# Patient Record
Sex: Male | Born: 1980 | Hispanic: Yes | Marital: Single | Smoking: Never smoker
Health system: Southern US, Community
[De-identification: ages and names within clinical notes are randomized; demographics above are authoritative.]

---

## 2018-02-22 ENCOUNTER — Emergency Department (HOSPITAL_COMMUNITY)
Admission: EM | Admit: 2018-02-22 | Discharge: 2018-02-23 | Disposition: A | Discharge status: Home / Self Care | Payer: Self-pay | Attending: Emergency Medicine | Admitting: Emergency Medicine

## 2018-02-22 ENCOUNTER — Other Ambulatory Visit: Payer: Self-pay

## 2018-02-22 ENCOUNTER — Emergency Department (HOSPITAL_COMMUNITY): Payer: Self-pay

## 2018-02-22 ENCOUNTER — Encounter (HOSPITAL_COMMUNITY): Payer: Self-pay | Admitting: Emergency Medicine

## 2018-02-22 DIAGNOSIS — R1013 Epigastric pain: Secondary | ICD-10-CM | POA: Insufficient documentation

## 2018-02-22 DIAGNOSIS — R112 Nausea with vomiting, unspecified: Secondary | ICD-10-CM | POA: Insufficient documentation

## 2018-02-22 DIAGNOSIS — R197 Diarrhea, unspecified: Secondary | ICD-10-CM | POA: Insufficient documentation

## 2018-02-22 LAB — COMPREHENSIVE METABOLIC PANEL
ALK PHOS: 97 U/L (ref 38–126)
ALT: 35 U/L (ref 17–63)
AST: 22 U/L (ref 15–41)
Albumin: 3.9 g/dL (ref 3.5–5.0)
Anion gap: 8 (ref 5–15)
BUN: 11 mg/dL (ref 6–20)
CALCIUM: 9.1 mg/dL (ref 8.9–10.3)
CO2: 25 mmol/L (ref 22–32)
CREATININE: 0.87 mg/dL (ref 0.61–1.24)
Chloride: 103 mmol/L (ref 101–111)
Glucose, Bld: 110 mg/dL — ABNORMAL HIGH (ref 65–99)
Potassium: 3.4 mmol/L — ABNORMAL LOW (ref 3.5–5.1)
Sodium: 136 mmol/L (ref 135–145)
Total Bilirubin: 0.7 mg/dL (ref 0.3–1.2)
Total Protein: 7.3 g/dL (ref 6.5–8.1)

## 2018-02-22 LAB — URINALYSIS, ROUTINE W REFLEX MICROSCOPIC
Bilirubin Urine: NEGATIVE
Glucose, UA: NEGATIVE mg/dL
KETONES UR: NEGATIVE mg/dL
Nitrite: NEGATIVE
PROTEIN: NEGATIVE mg/dL
Specific Gravity, Urine: 1.02 (ref 1.005–1.030)
pH: 6 (ref 5.0–8.0)

## 2018-02-22 LAB — CBC
HCT: 45.3 % (ref 39.0–52.0)
Hemoglobin: 15.5 g/dL (ref 13.0–17.0)
MCH: 30.1 pg (ref 26.0–34.0)
MCHC: 34.2 g/dL (ref 30.0–36.0)
MCV: 88 fL (ref 78.0–100.0)
PLATELETS: 157 10*3/uL (ref 150–400)
RBC: 5.15 MIL/uL (ref 4.22–5.81)
RDW: 12.4 % (ref 11.5–15.5)
WBC: 9.2 10*3/uL (ref 4.0–10.5)

## 2018-02-22 LAB — URINALYSIS, MICROSCOPIC (REFLEX)

## 2018-02-22 LAB — LIPASE, BLOOD: Lipase: 26 U/L (ref 11–51)

## 2018-02-22 NOTE — ED Provider Notes (Signed)
Patient placed in Quick Look pathway, seen and evaluated   Chief Complaint: abdominal pain  HPI:   Kenneth Price is a 37 y.o. male who presents to the ED with abdominal pain. The pain started yesterday and has gotten worse. The pain started all over with a feeling fullness and then went to the RUQ. Patient reports nausea and diarrhea.patient reports he has had similar pain in the past in the right side but it usually goes away but today it did not.    ROS: GI: abdominal pain nausea, diarrhea    Physical Exam:  BP 127/78 (BP Location: Right Arm)   Pulse (!) 108   Temp 99.3 F (37.4 C) (Oral)   Resp 14   SpO2 98%    Gen: No distress  Neuro: Awake and Alert  Skin: Warm  Abdomen: soft, tender with palpation to RUQ and epigastric area, no guarding or rebound   Initiation of care has begun. The patient has been counseled on the process, plan, and necessity for staying for the completion/evaluation, and the remainder of the medical screening examination    Janne Napoleoneese, Hope M, NP 02/22/18 2032    Melene PlanFloyd, Dan, DO 02/22/18 2324

## 2018-02-22 NOTE — ED Triage Notes (Signed)
Pt states RUQ abdominal pain since yesterday with nausea and diarrhea and headaches. Pt also states his feet hurt with generalized body aches. Pt took tylenol at 6pm. Pt drinks alcohol only on the weekends only.

## 2018-02-23 ENCOUNTER — Emergency Department (HOSPITAL_COMMUNITY): Payer: Self-pay

## 2018-02-23 MED ORDER — GI COCKTAIL ~~LOC~~
30.0000 mL | Freq: Once | ORAL | Status: AC
  Administered 2018-02-23: 30 mL via ORAL
  Filled 2018-02-23: qty 30

## 2018-02-23 MED ORDER — ONDANSETRON HCL 4 MG/2ML IJ SOLN
4.0000 mg | Freq: Once | INTRAMUSCULAR | Status: AC
  Administered 2018-02-23: 4 mg via INTRAVENOUS
  Filled 2018-02-23: qty 2

## 2018-02-23 MED ORDER — MORPHINE SULFATE (PF) 4 MG/ML IV SOLN
4.0000 mg | Freq: Once | INTRAVENOUS | Status: AC
  Administered 2018-02-23: 4 mg via INTRAVENOUS
  Filled 2018-02-23: qty 1

## 2018-02-23 MED ORDER — ONDANSETRON 4 MG PO TBDP: 4.0000 mg | ORAL_TABLET | Freq: Once | ORAL | Status: DC

## 2018-02-23 MED ORDER — ONDANSETRON 4 MG PO TBDP: 4.0000 mg | ORAL_TABLET | Freq: Three times a day (TID) | ORAL | 0 refills | Status: AC | PRN

## 2018-02-23 NOTE — ED Provider Notes (Signed)
[facility] EMERGENCY DEPARTMENT Provider Note   CSN: [id number] Arrival date & time: 02/22/18  1933     History   Chief Complaint Chief Complaint  Patient presents with  . Abdominal Pain    HPI Kenneth Price is a 37 y.o. male.  Patient presents to the emergency department with a chief complaint of abdominal pain.  He reports having the symptoms since Sunday night.  He reports that he has had some associated nausea and diarrhea.  He denies any vomiting.  He reports subjective fevers and chills.  He states that he does have some flank pain on the right side as well.  He denies any known sick contacts.  He has not taken anything for symptoms.  The history is provided by the patient and the spouse. A language interpreter was used.    History reviewed. No pertinent past medical history.  There are no active problems to display for this patient.     Home Medications    Prior to Admission medications   Not on File    Family History No family history on file.  Social History Social History   Tobacco Use  . Smoking status: Not on file  Substance Use Topics  . Alcohol use: Not on file  . Drug use: Not on file     Allergies   Patient has no known allergies.   Review of Systems Review of Systems  All other systems reviewed and are negative.    Physical Exam Updated Vital Signs BP 128/77   Pulse (!) 109   Temp 99.3 F (37.4 C)   Resp 19   SpO2 99%   Physical Exam  Constitutional: He is oriented to person, place, and time. He appears well-developed and well-nourished.  HENT:  Head: Normocephalic and atraumatic.  Eyes: Pupils are equal, round, and reactive to light. Conjunctivae and EOM are normal. Right eye exhibits no discharge. Left eye exhibits no discharge. No scleral icterus.  Neck: Normal range of motion. Neck supple. No JVD present.  Cardiovascular: Normal rate, regular rhythm and normal heart sounds. Exam reveals no  gallop and no friction rub.  No murmur heard. Pulmonary/Chest: Effort normal and breath sounds normal. No respiratory distress. He has no wheezes. He has no rales. He exhibits no tenderness.  Abdominal: Soft. He exhibits no distension and no mass. There is no tenderness. There is no rebound and no guarding.  Musculoskeletal: Normal range of motion. He exhibits no edema or tenderness.  Neurological: He is alert and oriented to person, place, and time.  Skin: Skin is warm and dry.  Psychiatric: He has a normal mood and affect. His behavior is normal. Judgment and thought content normal.  Nursing note and vitals reviewed.    ED Treatments / Results  Labs (all labs ordered are listed, but only abnormal results are displayed) Labs Reviewed  COMPREHENSIVE METABOLIC PANEL - Abnormal; Notable for the following components:      Result Value   Potassium 3.4 (*)    Glucose, Bld 110 (*)    All other components within normal limits  URINALYSIS, ROUTINE W REFLEX MICROSCOPIC - Abnormal; Notable for the following components:   Hgb urine dipstick SMALL (*)    Leukocytes, UA TRACE (*)    All other components within normal limits  URINALYSIS, MICROSCOPIC (REFLEX) - Abnormal; Notable for the following components:   Bacteria, UA RARE (*)    All other components within normal limits  LIPASE, BLOOD  CBC  EKG None  Radiology [place] Abdomen Limited  Result Date: 02/22/2018 CLINICAL DATA:  Right upper quadrant abdominal pain and nausea for 2 days. EXAM: ULTRASOUND ABDOMEN LIMITED RIGHT UPPER QUADRANT COMPARISON:  None. FINDINGS: Gallbladder: Contracted gallbladder, limiting evaluation. Patient is reportedly not NPO. No cholelithiasis demonstrated. No gallbladder wall thickening, pericholecystic fluid or sonographic Murphy's sign. Common bile duct: Diameter: 4 mm Liver: Liver parenchyma is diffusely markedly echogenic with posterior acoustic attenuation, compatible with diffuse hepatic steatosis. No  definite liver surface irregularity. No liver mass, noting significantly decreased sensitivity in the setting of marked steatosis. Portal vein is patent on color Doppler imaging with normal direction of blood flow towards the liver. IMPRESSION: 1. Contracted gallbladder, limiting evaluation. Patient is reportedly not NPO. No evidence of cholelithiasis or acute cholecystitis. 2. No biliary ductal dilatation. 3. Diffuse hepatic steatosis. Electronically Signed   By: Delbert PhenixJason A Poff M.D.   On: 02/22/2018 21:31    Procedures Procedures (including critical care time)  Medications Ordered in ED Medications  gi cocktail (Maalox,Lidocaine,Donnatal) (has no administration in time range)  ondansetron (ZOFRAN) injection 4 mg (has no administration in time range)  morphine 4 MG/ML injection 4 mg (has no administration in time range)     Initial Impression / Assessment and Plan / ED Course  I have reviewed the triage vital signs and the nursing notes.  Pertinent labs & imaging results that were available during my care of the patient were reviewed by me and considered in my medical decision making (see chart for details).     Patient with nausea and diarrhea.  He has not vomited.  He does have some right-sided abdominal pain.  Right upper quadrant ultrasound is negative, no evidence of cholecystitis or cholelithiasis.  He has no renal stones on CT.  Question colitis on CT.  This is consistent with the patient's symptoms.  Will give Zofran and recommend PCP follow-up.  Final Clinical Impressions(s) / ED Diagnoses   Final diagnoses:  Epigastric pain  Nausea vomiting and diarrhea    ED Discharge Orders    None       Roxy HorsemanBrowning, Jathen Sudano, PA-C 02/23/18 0300    Wilkie AyeHorton, Mayer Maskerourtney F, MD 02/23/18 [phone]

## 2021-05-11 ENCOUNTER — Encounter (HOSPITAL_COMMUNITY): Payer: Self-pay | Admitting: Emergency Medicine

## 2021-05-11 ENCOUNTER — Emergency Department (HOSPITAL_COMMUNITY)
Admission: EM | Admit: 2021-05-11 | Discharge: 2021-05-11 | Disposition: A | Discharge status: Home / Self Care | Payer: Self-pay | Attending: Emergency Medicine | Admitting: Emergency Medicine

## 2021-05-11 ENCOUNTER — Emergency Department (HOSPITAL_COMMUNITY): Payer: Self-pay

## 2021-05-11 ENCOUNTER — Other Ambulatory Visit: Payer: Self-pay

## 2021-05-11 DIAGNOSIS — R079 Chest pain, unspecified: Secondary | ICD-10-CM | POA: Insufficient documentation

## 2021-05-11 DIAGNOSIS — R202 Paresthesia of skin: Secondary | ICD-10-CM | POA: Insufficient documentation

## 2021-05-11 DIAGNOSIS — R519 Headache, unspecified: Secondary | ICD-10-CM | POA: Insufficient documentation

## 2021-05-11 DIAGNOSIS — R0602 Shortness of breath: Secondary | ICD-10-CM | POA: Insufficient documentation

## 2021-05-11 LAB — TROPONIN I (HIGH SENSITIVITY)
Troponin I (High Sensitivity): 3 ng/L (ref <18)
Troponin I (High Sensitivity): 4 ng/L (ref <18)

## 2021-05-11 LAB — BASIC METABOLIC PANEL
Anion gap: 8 (ref 5–15)
BUN: 13 mg/dL (ref 6–20)
CO2: 23 mmol/L (ref 22–32)
Calcium: 8.8 mg/dL — ABNORMAL LOW (ref 8.9–10.3)
Chloride: 105 mmol/L (ref 98–111)
Creatinine, Ser: 0.87 mg/dL (ref 0.61–1.24)
GFR, Estimated: >60 mL/min (ref >60)
Glucose, Bld: 99 mg/dL (ref 70–99)
Potassium: 4 mmol/L (ref 3.5–5.1)
Sodium: 136 mmol/L (ref 135–145)

## 2021-05-11 LAB — CBC
HCT: 45.9 % (ref 39.0–52.0)
Hemoglobin: 15.7 g/dL (ref 13.0–17.0)
MCH: 30.4 pg (ref 26.0–34.0)
MCHC: 34.2 g/dL (ref 30.0–36.0)
MCV: 89 fL (ref 80.0–100.0)
Platelets: 163 10*3/uL (ref 150–400)
RBC: 5.16 MIL/uL (ref 4.22–5.81)
RDW: 12.3 % (ref 11.5–15.5)
WBC: 8.2 10*3/uL (ref 4.0–10.5)
nRBC: 0 % (ref 0.0–0.2)

## 2021-05-11 MED ORDER — DIPHENHYDRAMINE HCL 50 MG/ML IJ SOLN
25.0000 mg | Freq: Once | INTRAMUSCULAR | Status: AC
  Administered 2021-05-11: 25 mg via INTRAVENOUS
  Filled 2021-05-11: qty 1

## 2021-05-11 MED ORDER — METOCLOPRAMIDE HCL 5 MG/ML IJ SOLN
10.0000 mg | Freq: Once | INTRAMUSCULAR | Status: AC
  Administered 2021-05-11: 10 mg via INTRAVENOUS
  Filled 2021-05-11: qty 2

## 2021-05-11 MED ORDER — LACTATED RINGERS IV BOLUS
1000.0000 mL | Freq: Once | INTRAVENOUS | Status: AC
Start: 2021-05-11 — End: 2021-05-11
  Administered 2021-05-11: 1000 mL via INTRAVENOUS

## 2021-05-11 MED ORDER — GADOBUTROL 1 MMOL/ML IV SOLN
10.0000 mL | Freq: Once | INTRAVENOUS | Status: AC | PRN
  Administered 2021-05-11: 10 mL via INTRAVENOUS

## 2021-05-11 MED ORDER — KETOROLAC TROMETHAMINE 15 MG/ML IJ SOLN
15.0000 mg | Freq: Once | INTRAMUSCULAR | Status: AC
  Administered 2021-05-11: 15 mg via INTRAVENOUS
  Filled 2021-05-11: qty 1

## 2021-05-11 NOTE — ED Triage Notes (Signed)
Pt reports intermittent chest pain x 1 month.  States he over exerted himself on Thursday and reports numbness to bilateral hands and pain across chest that is worse on the left side.  Denies sob, nausea, and vomiting.  Also reports headache.

## 2021-05-11 NOTE — ED Notes (Signed)
Pt transported to MRI 

## 2021-05-11 NOTE — ED Provider Notes (Signed)
With mild [facility] EMERGENCY DEPARTMENT Provider Note   CSN: [id number] Arrival date & time: 05/11/21  1304     History Chief Complaint  Patient presents with   Chest Pain    Kenneth Price is a 40 y.o. male.  HPI 40 year old male presents with complaints.  History is through the [place] interpreter.  His chief complaint seems to be chest pain and headache that both started 2 days ago while at work.  The chest is worse than the head.  Both feel like a pressure.  He has taken ibuprofen without relief.  Some shortness of breath.  He also endorses left arm and leg numbness/tingling for a month or so.  However it seems to be worse with the symptoms.  He transiently had some blurry vision yesterday while driving but that is gone.  History reviewed. No pertinent past medical history.  There are no problems to display for this patient.   History reviewed. No pertinent surgical history.     No family history on file.  Social History   Tobacco Use   Smoking status: Never   Smokeless tobacco: Never  Substance Use Topics   Alcohol use: Yes   Drug use: Not Currently    Home Medications Prior to Admission medications   Medication Sig Start Date End Date Taking? Authorizing Provider  ondansetron (ZOFRAN ODT) 4 MG disintegrating tablet Take 1 tablet (4 mg total) by mouth every 8 (eight) hours as needed for nausea or vomiting. 02/23/18   Roxy Horseman, PA-C    Allergies    Patient has no known allergies.  Review of Systems   Review of Systems  Constitutional:  Negative for fever.  Respiratory:  Positive for shortness of breath. Negative for cough.   Cardiovascular:  Positive for chest pain.  Neurological:  Positive for numbness and headaches. Negative for weakness.  All other systems reviewed and are negative.  Physical Exam Updated Vital Signs BP 107/80   Pulse (!) 54   Temp 97.9 F (36.6 C) (Oral)   Resp 14   Ht 5\' 6"  (1.676 m)   Wt  104.3 kg   SpO2 98%   BMI 37.12 kg/m   Physical Exam Vitals and nursing note reviewed.  Constitutional:      Appearance: He is well-developed.  HENT:     Head: Normocephalic and atraumatic.     Right Ear: External ear normal.     Left Ear: External ear normal.     Nose: Nose normal.  Eyes:     General:        Right eye: No discharge.        Left eye: No discharge.  Cardiovascular:     Rate and Rhythm: Normal rate and regular rhythm.     Heart sounds: Normal heart sounds.  Pulmonary:     Effort: Pulmonary effort is normal.     Breath sounds: Normal breath sounds.  Abdominal:     Palpations: Abdomen is soft.     Tenderness: There is no abdominal tenderness.  Musculoskeletal:     Cervical back: Neck supple.  Skin:    General: Skin is warm and dry.  Neurological:     Mental Status: He is alert.     Comments: CN 3-12 grossly intact. 5/5 strength in all 4 extremities. Grossly normal sensation in RUE, RLE, but he feels mildly different to him on left arm/leg and left face. Normal finger to nose.   Psychiatric:  Mood and Affect: Mood is not anxious.    ED Results / Procedures / Treatments   Labs (all labs ordered are listed, but only abnormal results are displayed) Labs Reviewed  BASIC METABOLIC PANEL - Abnormal; Notable for the following components:      Result Value   Calcium 8.8 (*)    All other components within normal limits  CBC  TROPONIN I (HIGH SENSITIVITY)  TROPONIN I (HIGH SENSITIVITY)    EKG EKG Interpretation  Date/Time:  Saturday May 11 2021 13:18:57 EDT Ventricular Rate:  82 PR Interval:  138 QRS Duration: 78 QT Interval:  354 QTC Calculation: 413 R Axis:   26 Text Interpretation: Normal sinus rhythm Normal ECG No old tracing to compare Confirmed by Pricilla Loveless [phone]) on 05/11/2021 5:14:24 PM  Radiology DG Chest 2 View  Result Date: 05/11/2021 CLINICAL DATA:  Chest pain and shortness of breath. EXAM: CHEST - 2 VIEW COMPARISON:   None. FINDINGS: The heart size and mediastinal contours are within normal limits. Both lungs are clear. The visualized skeletal structures are unremarkable. IMPRESSION: No active cardiopulmonary disease. Electronically Signed   By: Obie Dredge M.D.   On: 05/11/2021 13:52   MR Brain W and Wo Contrast  Result Date: 05/11/2021 CLINICAL DATA:  Initial evaluation for multiple sclerosis, new event. EXAM: MRI HEAD WITHOUT AND WITH CONTRAST TECHNIQUE: Multiplanar, multiecho pulse sequences of the brain and surrounding structures were obtained without and with intravenous contrast. CONTRAST:  20mL GADAVIST GADOBUTROL 1 MMOL/ML IV SOLN COMPARISON:  None available. FINDINGS: Brain: Cerebral volume within normal limits for patient age. No focal parenchymal signal abnormality identified. No findings to suggest demyelinating disease. No abnormal foci of restricted diffusion to suggest acute or subacute ischemia. Gray-white matter differentiation well maintained. No encephalomalacia to suggest chronic infarction. No foci of susceptibility artifact to suggest acute or chronic intracranial hemorrhage. No mass lesion, midline shift or mass effect. No hydrocephalus. Possible small 1.8 cm benign arachnoid cyst at the anterior left temporal pole. No other extra-axial fluid collection. Pituitary gland and suprasellar region are normal. Midline structures intact and normal. No abnormal enhancement. Vascular: Major intracranial vascular flow voids well maintained and normal in appearance. Skull and upper cervical spine: Craniocervical junction normal. Visualized upper cervical spine within normal limits. Bone marrow signal intensity normal. No scalp soft tissue abnormality. Sinuses/Orbits: Globes and orbital soft tissues within normal limits. Scattered mucosal thickening noted within the sphenoid ethmoidal and maxillary sinuses. Paranasal sinuses are otherwise largely clear. No significant mastoid effusion. Inner ear structures  grossly normal. Other: None. IMPRESSION: Normal brain MRI.  No acute intracranial abnormality identified. Electronically Signed   By: Rise Mu M.D.   On: 05/11/2021 21:34    Procedures Procedures   Medications Ordered in ED Medications  metoCLOPramide (REGLAN) injection 10 mg (10 mg Intravenous Given 05/11/21 1918)  diphenhydrAMINE (BENADRYL) injection 25 mg (25 mg Intravenous Given 05/11/21 1915)  ketorolac (TORADOL) 15 MG/ML injection 15 mg (15 mg Intravenous Given 05/11/21 1920)  lactated ringers bolus 1,000 mL (0 mLs Intravenous Stopped 05/11/21 2154)  gadobutrol (GADAVIST) 1 MMOL/ML injection 10 mL (10 mLs Intravenous Contrast Given 05/11/21 2003)    ED Course  I have reviewed the triage vital signs and the nursing notes.  Pertinent labs & imaging results that were available during my care of the patient were reviewed by me and considered in my medical decision making (see chart for details).    MDM Rules/Calculators/A&P  Patient's chest pain appears to be atypical and unclear in cause.  I have low suspicion for PE, ACS, dissection.  Troponins and ECG are benign.  As for this on and off numbness, given his age and vague findings that are unilateral I want to rule out MS and other acute CNS emergency so MRI with and without contrast was obtained and is negative.  He is feeling better from both the headache and chest pain perspective after treatment.  At this point there is not appear to be an emergent cause and so I think he can follow-up with the PCP as well as neurology for further outpatient work-up as needed. Final Clinical Impression(s) / ED Diagnoses Final diagnoses:  Nonspecific chest pain  Paresthesia of left arm and leg    Rx / DC Orders ED Discharge Orders          Ordered    Ambulatory referral to Neurology       Comments: An appointment is requested in approximately: 2 weeks   05/11/21 2155             Pricilla Loveless,  MD 05/11/21 2316

## 2021-05-11 NOTE — ED Notes (Signed)
Patient transported to X-ray 

## 2021-05-11 NOTE — ED Provider Notes (Signed)
Emergency Medicine Provider Triage Evaluation Note  Kenneth Price , a 40 y.o. male  was evaluated in triage.  Pt complains of chest pain and pressure that radiates to the left arm in the neck with associated left arm numbness and tingling since Thursday (3 days ago) no history of the same..  Review of Systems  Positive: Chest pain, shortness of breath, palpitations, left arm numbness and tingling Negative: Syncope  Physical Exam  BP 140/83 (BP Location: Left Arm)   Pulse 70   Temp 98.8 F (37.1 C) (Oral)   Resp 18   SpO2 100%  Gen:   Awake, no distress   Resp:  Normal effort  MSK:   Moves extremities without difficulty  Other:  RRR, no  m/r/g, lungs CTAB. Symmetric grip strength bilaterally  Medical Decision Making  Medically screening exam initiated at 1:25 PM.  Appropriate orders placed.  Kenneth Price was informed that the remainder of the evaluation will be completed by another provider, this initial triage assessment does not replace that evaluation, and the importance of remaining in the ED until their evaluation is complete.  This chart was dictated using voice recognition software, Dragon. Despite the best efforts of this provider to proofread and correct errors, errors may still occur which can change documentation meaning.    Sherrilee Gilles 05/11/21 1336    Maia Plan, MD 05/19/21 1746

## 2021-05-11 NOTE — Discharge Instructions (Signed)
If you develop recurrent, continued, or worsening chest pain, shortness of breath, fever, vomiting, abdominal or back pain, or any other new/concerning symptoms then return to the ER for evaluation.  ? ?Si desarrolla dolor de pecho recurrente, continuo o que empeora, dificultad para respirar, fiebre, v?mitos, dolor abdominal o de espalda, o cualquier otro s?ntoma nuevo o preocupante, regrese a la sala de emergencias para una evaluaci?n. ?

## 2023-02-07 ENCOUNTER — Other Ambulatory Visit: Payer: Self-pay

## 2023-02-07 ENCOUNTER — Encounter (HOSPITAL_COMMUNITY): Payer: Self-pay | Admitting: Emergency Medicine

## 2023-02-07 ENCOUNTER — Emergency Department (HOSPITAL_COMMUNITY)
Admission: EM | Admit: 2023-02-07 | Discharge: 2023-02-07 | Disposition: A | Discharge status: Home / Self Care | Payer: Self-pay | Attending: Emergency Medicine | Admitting: Emergency Medicine

## 2023-02-07 ENCOUNTER — Emergency Department (HOSPITAL_COMMUNITY): Payer: Self-pay

## 2023-02-07 DIAGNOSIS — M545 Low back pain, unspecified: Secondary | ICD-10-CM | POA: Insufficient documentation

## 2023-02-07 LAB — CBC
HCT: 46.1 % (ref 39.0–52.0)
Hemoglobin: 15.8 g/dL (ref 13.0–17.0)
MCH: 30.6 pg (ref 26.0–34.0)
MCHC: 34.3 g/dL (ref 30.0–36.0)
MCV: 89.3 fL (ref 80.0–100.0)
Platelets: 162 10*3/uL (ref 150–400)
RBC: 5.16 MIL/uL (ref 4.22–5.81)
RDW: 12.4 % (ref 11.5–15.5)
WBC: 8.7 10*3/uL (ref 4.0–10.5)
nRBC: 0 % (ref 0.0–0.2)

## 2023-02-07 LAB — COMPREHENSIVE METABOLIC PANEL
ALT: 42 U/L (ref 0–44)
AST: 22 U/L (ref 15–41)
Albumin: 3.8 g/dL (ref 3.5–5.0)
Alkaline Phosphatase: 81 U/L (ref 38–126)
Anion gap: 8 (ref 5–15)
BUN: 14 mg/dL (ref 6–20)
CO2: 25 mmol/L (ref 22–32)
Calcium: 8.8 mg/dL — ABNORMAL LOW (ref 8.9–10.3)
Chloride: 106 mmol/L (ref 98–111)
Creatinine, Ser: 0.79 mg/dL (ref 0.61–1.24)
GFR, Estimated: >60 mL/min (ref >60)
Glucose, Bld: 104 mg/dL — ABNORMAL HIGH (ref 70–99)
Potassium: 4.1 mmol/L (ref 3.5–5.1)
Sodium: 139 mmol/L (ref 135–145)
Total Bilirubin: 0.5 mg/dL (ref 0.3–1.2)
Total Protein: 6.8 g/dL (ref 6.5–8.1)

## 2023-02-07 LAB — URINALYSIS, ROUTINE W REFLEX MICROSCOPIC
Bilirubin Urine: NEGATIVE
Glucose, UA: NEGATIVE mg/dL
Hgb urine dipstick: NEGATIVE
Ketones, ur: NEGATIVE mg/dL
Leukocytes,Ua: NEGATIVE
Nitrite: NEGATIVE
Protein, ur: NEGATIVE mg/dL
Specific Gravity, Urine: 1.017 (ref 1.005–1.030)
pH: 7 (ref 5.0–8.0)

## 2023-02-07 MED ORDER — CYCLOBENZAPRINE HCL 10 MG PO TABS
10.0000 mg | ORAL_TABLET | Freq: Once | ORAL | Status: AC
  Administered 2023-02-07: 10 mg via ORAL
  Filled 2023-02-07: qty 1

## 2023-02-07 MED ORDER — CYCLOBENZAPRINE HCL 10 MG PO TABS: 10.0000 mg | ORAL_TABLET | Freq: Three times a day (TID) | ORAL | 0 refills | Status: AC

## 2023-02-07 MED ORDER — PREDNISONE 10 MG PO TABS: 40.0000 mg | ORAL_TABLET | Freq: Every day | ORAL | 0 refills | Status: AC

## 2023-02-07 MED ORDER — KETOROLAC TROMETHAMINE 15 MG/ML IJ SOLN
15.0000 mg | Freq: Once | INTRAMUSCULAR | Status: AC
  Administered 2023-02-07: 15 mg via INTRAMUSCULAR
  Filled 2023-02-07: qty 1

## 2023-02-07 NOTE — Discharge Instructions (Addendum)
Los resultados de sus laboratorios fueron normales durnate su visit.   Le dimos una copia del xray de su espalda.   Le he recetado medicina para ayudar con Chief Technology Officer. [employer] como es indicada.  Flexeril debe tomarse 1 tableta tres veces al dia, tenga en cuenta que esta medicina puede causar mareos no maneje cuando este tomando [address]  La siguiente medicina son steroides, tome 4 tabletas diarias por cinco dias. Esta medicina puede causar insomina, cambio de apetito.

## 2023-02-07 NOTE — ED Provider Notes (Signed)
[place] EMERGENCY DEPARTMENT AT [facility] Provider Note   CSN: [id number] Arrival date & time: 02/07/23  1305     History  Chief Complaint  Patient presents with   Back Pain    Kenneth Price Character is a 42 y.o. male.  42 year old male with no past medical history presents to the ED with a chief complaint of lower back pain that is been ongoing for the past month.  He describes it as a sharp stabbing sensation to the lumbar spine radiating to the right side of his abdomen.  He reports this is exacerbated with any ambulation, along with weightbearing.  He has tried taking ibuprofen without any improvement in symptoms.  Patient is employed in [employer], does do some lifting for work but reports this is not heavy in nature.  He does feel like when he urinates he is unable to fully void.  However, has not had any dysuria, or hematuria that he is noted.  No fever, no bowel or bladder complaints, no prior history of IV drug use or cancer.  The history is provided by the patient.  Back Pain Associated symptoms: no abdominal pain, no chest pain, no dysuria and no fever        Home Medications Prior to Admission medications   Medication Sig Start Date End Date Taking? Authorizing Provider  cyclobenzaprine (FLEXERIL) 10 MG tablet Take 1 tablet (10 mg total) by mouth 3 (three) times daily for 5 days. 02/07/23 02/12/23 Yes Bradleigh Sonnen, PA-C  ondansetron (ZOFRAN ODT) 4 MG disintegrating tablet Take 1 tablet (4 mg total) by mouth every 8 (eight) hours as needed for nausea or vomiting. 02/23/18   Roxy Horseman, PA-C  predniSONE (DELTASONE) 10 MG tablet Take 4 tablets (40 mg total) by mouth daily for 5 days. 02/07/23 02/12/23 Yes Claude Manges, PA-C      Allergies    Patient has no known allergies.    Review of Systems   Review of Systems  Constitutional:  Negative for chills and fever.  HENT:  Negative for sore throat.   Respiratory:  Negative for shortness of breath.    Cardiovascular:  Negative for chest pain.  Gastrointestinal:  Negative for abdominal pain, nausea and vomiting.  Genitourinary:  Negative for dysuria and flank pain.  Musculoskeletal:  Positive for back pain.  All other systems reviewed and are negative.   Physical Exam Updated Vital Signs BP (!) 136/95 (BP Location: Right Arm)   Pulse 81   Temp 98 F (36.7 C)   Resp 18   SpO2 98%  Physical Exam Vitals and nursing note reviewed.  Constitutional:      Appearance: He is well-developed.  HENT:     Head: Normocephalic and atraumatic.  Eyes:     General: No scleral icterus.    Pupils: Pupils are equal, round, and reactive to light.  Cardiovascular:     Heart sounds: Normal heart sounds.  Pulmonary:     Effort: Pulmonary effort is normal.     Breath sounds: Normal breath sounds. No wheezing.  Chest:     Chest wall: No tenderness.  Abdominal:     General: Bowel sounds are normal. There is no distension.     Palpations: Abdomen is soft.     Tenderness: There is no abdominal tenderness.  Musculoskeletal:        General: No deformity.     Cervical back: Normal range of motion.     Lumbar back: Tenderness present.  Back:     Comments: RLE- KF,KE 5/5 strength LLE- HF, HE 5/5 strength Normal gait. No pronator drift. No leg drop.  CN I, II and VIII not tested. CN II-XII grossly intact bilaterally.      Skin:    General: Skin is warm and dry.  Neurological:     Mental Status: He is alert and oriented to person, place, and time.     ED Results / Procedures / Treatments   Labs (all labs ordered are listed, but only abnormal results are displayed) Labs Reviewed  COMPREHENSIVE METABOLIC PANEL - Abnormal; Notable for the following components:      Result Value   Glucose, Bld 104 (*)    Calcium 8.8 (*)    All other components within normal limits  CBC  URINALYSIS, ROUTINE W REFLEX MICROSCOPIC    EKG None  Radiology DG Lumbar Spine Complete  Result Date:  02/07/2023 CLINICAL DATA:  Back pain EXAM: LUMBAR SPINE - COMPLETE 4+ VIEW COMPARISON:  CT abdomen 02/23/2018 FINDINGS: Mild spurring anterior to the vertebral column at the L2-3 level, no change from 02/23/2018. Preserved intervertebral disc height. No fracture malalignment. No acute bony findings. IMPRESSION: 1. Mild spurring at the L2-3 level. No acute findings. Electronically Signed   By: Gaylyn Rong M.D.   On: 02/07/2023 14:41    Procedures Procedures    Medications Ordered in ED Medications  ketorolac (TORADOL) 15 MG/ML injection 15 mg (15 mg Intramuscular Given 02/07/23 1412)  cyclobenzaprine (FLEXERIL) tablet 10 mg (10 mg Oral Given 02/07/23 1411)    ED Course/ Medical Decision Making/ A&P                             Medical Decision Making Amount and/or Complexity of Data Reviewed Labs: ordered. Radiology: ordered.  Risk Prescription drug management.   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  He does not have any bowel or bladder complaints, does report the pain shoots to the right side along the frontal aspect of his abdomen.  On evaluation, he is overall hemodynamically stable.  He is having pain with palpation along the lumbar spine, abdomen is soft nontender to palpation.  Did ambulate in the emergency department with a steady gait.  Interpretation of his blood work revealed a CBC with no leukocytosis, hemoglobin is within normal limits.  CMP with no electrolyte derangement, her creatinine levels normal.  LFTs are unremarkable.  Urinalysis without any hemoglobin, or nitrites, have a low suspicion for infection versus renal colic.  There is no CVA tenderness on my exam to indicate CT renal at this time.  I do suspect more so MSK component, will obtain x-ray along with treat him for pain with Toradol, Flexeril.   X-ray of his lumbar spine  with mild spurring noted.  He does report some improvement after symptomatic treatment for likely MSK component.  We discussed sending him home on a short course of steroids along with muscle relaxers.  Patient is agreeable with pain and treatment.  Hemodynamically stable for discharge.   Portions of this note were generated with Scientist, clinical (histocompatibility and immunogenetics). Dictation errors may occur despite best attempts at proofreading.   Final Clinical Impression(s) / ED Diagnoses Final diagnoses:  Acute midline low back pain without sciatica    Rx / DC Orders  ED Discharge Orders          Ordered    predniSONE (DELTASONE) 10 MG tablet  Daily        02/07/23 1445    cyclobenzaprine (FLEXERIL) 10 MG tablet  3 times daily        02/07/23 1445              Claude Manges, PA-C 02/07/23 1448    Rexford Maus, DO 02/07/23 1535

## 2023-02-07 NOTE — ED Triage Notes (Signed)
Pt reports bilateral back pain that radiates to the right flank. Pt reports he is having trouble urinating. Denies fevers and N/V.

## 2023-02-11 ENCOUNTER — Other Ambulatory Visit: Payer: Self-pay

## 2023-02-11 ENCOUNTER — Encounter (HOSPITAL_COMMUNITY): Payer: Self-pay | Admitting: *Deleted

## 2023-02-11 ENCOUNTER — Emergency Department (HOSPITAL_COMMUNITY)
Admission: EM | Admit: 2023-02-11 | Discharge: 2023-02-12 | Disposition: A | Discharge status: Home / Self Care | Payer: Self-pay | Attending: Emergency Medicine | Admitting: Emergency Medicine

## 2023-02-11 DIAGNOSIS — Y990 Civilian activity done for income or pay: Secondary | ICD-10-CM | POA: Insufficient documentation

## 2023-02-11 DIAGNOSIS — D72829 Elevated white blood cell count, unspecified: Secondary | ICD-10-CM | POA: Insufficient documentation

## 2023-02-11 DIAGNOSIS — Y9269 Other specified industrial and construction area as the place of occurrence of the external cause: Secondary | ICD-10-CM | POA: Insufficient documentation

## 2023-02-11 DIAGNOSIS — X58XXXA Exposure to other specified factors, initial encounter: Secondary | ICD-10-CM | POA: Insufficient documentation

## 2023-02-11 DIAGNOSIS — S39012A Strain of muscle, fascia and tendon of lower back, initial encounter: Secondary | ICD-10-CM | POA: Insufficient documentation

## 2023-02-11 DIAGNOSIS — S39012D Strain of muscle, fascia and tendon of lower back, subsequent encounter: Secondary | ICD-10-CM

## 2023-02-11 DIAGNOSIS — R1031 Right lower quadrant pain: Secondary | ICD-10-CM | POA: Insufficient documentation

## 2023-02-11 LAB — URINALYSIS, ROUTINE W REFLEX MICROSCOPIC
Bacteria, UA: NONE SEEN
Bilirubin Urine: NEGATIVE
Glucose, UA: NEGATIVE mg/dL
Ketones, ur: NEGATIVE mg/dL
Leukocytes,Ua: NEGATIVE
Nitrite: NEGATIVE
Protein, ur: NEGATIVE mg/dL
Specific Gravity, Urine: 1.011 (ref 1.005–1.030)
pH: 5 (ref 5.0–8.0)

## 2023-02-11 LAB — CBC
HCT: 44.9 % (ref 39.0–52.0)
Hemoglobin: 15.6 g/dL (ref 13.0–17.0)
MCH: 30.3 pg (ref 26.0–34.0)
MCHC: 34.7 g/dL (ref 30.0–36.0)
MCV: 87.2 fL (ref 80.0–100.0)
Platelets: 199 10*3/uL (ref 150–400)
RBC: 5.15 MIL/uL (ref 4.22–5.81)
RDW: 12.3 % (ref 11.5–15.5)
WBC: 13 10*3/uL — ABNORMAL HIGH (ref 4.0–10.5)
nRBC: 0 % (ref 0.0–0.2)

## 2023-02-11 NOTE — ED Triage Notes (Signed)
The pt is c/o lower abd pain for 5 days  no n or vomiting.  He is also constipated

## 2023-02-12 ENCOUNTER — Emergency Department (HOSPITAL_COMMUNITY): Payer: Self-pay

## 2023-02-12 LAB — COMPREHENSIVE METABOLIC PANEL
ALT: 44 U/L (ref 0–44)
AST: 26 U/L (ref 15–41)
Albumin: 4.1 g/dL (ref 3.5–5.0)
Alkaline Phosphatase: 89 U/L (ref 38–126)
Anion gap: 8 (ref 5–15)
BUN: 20 mg/dL (ref 6–20)
CO2: 25 mmol/L (ref 22–32)
Calcium: 8.8 mg/dL — ABNORMAL LOW (ref 8.9–10.3)
Chloride: 102 mmol/L (ref 98–111)
Creatinine, Ser: 0.8 mg/dL (ref 0.61–1.24)
GFR, Estimated: >60 mL/min (ref >60)
Glucose, Bld: 121 mg/dL — ABNORMAL HIGH (ref 70–99)
Potassium: 3.8 mmol/L (ref 3.5–5.1)
Sodium: 135 mmol/L (ref 135–145)
Total Bilirubin: 0.6 mg/dL (ref 0.3–1.2)
Total Protein: 7.2 g/dL (ref 6.5–8.1)

## 2023-02-12 LAB — LIPASE, BLOOD: Lipase: 34 U/L (ref 11–51)

## 2023-02-12 MED ORDER — KETOROLAC TROMETHAMINE 15 MG/ML IJ SOLN
15.0000 mg | Freq: Once | INTRAMUSCULAR | Status: AC
  Administered 2023-02-12: 15 mg via INTRAVENOUS
  Filled 2023-02-12: qty 1

## 2023-02-12 MED ORDER — HYDROMORPHONE HCL 1 MG/ML IJ SOLN
0.5000 mg | INTRAMUSCULAR | Status: DC | PRN
  Administered 2023-02-12: 0.5 mg via INTRAVENOUS
  Filled 2023-02-12: qty 1

## 2023-02-12 MED ORDER — IOHEXOL 350 MG/ML SOLN
75.0000 mL | Freq: Once | INTRAVENOUS | Status: AC | PRN
  Administered 2023-02-12: 75 mL via INTRAVENOUS

## 2023-02-12 MED ORDER — SODIUM CHLORIDE 0.9 % IV BOLUS
1000.0000 mL | Freq: Once | INTRAVENOUS | Status: AC
  Administered 2023-02-12: 1000 mL via INTRAVENOUS

## 2023-02-12 MED ORDER — NAPROXEN 375 MG PO TABS: 375.0000 mg | ORAL_TABLET | Freq: Two times a day (BID) | ORAL | 0 refills | Status: AC

## 2023-02-12 NOTE — Discharge Instructions (Addendum)
Puede tomar Motrin (Ibuprofen) o Aleve (Naproxen), Acetaminophen (Tylenol), crema para los musculos como SalonPas, Icy Hot, Bengay, etc. Puede estrechar, ponerce hielo o comprecion de calor, o que le den masaje. ° °

## 2023-02-12 NOTE — ED Notes (Signed)
Discharge instructions discussed with pt. Verbalized understanding. VSS. No questions or concerns regarding discharge  

## 2023-02-12 NOTE — ED Notes (Signed)
Patient transported to CT 

## 2023-02-12 NOTE — ED Provider Notes (Signed)
[place] EMERGENCY DEPARTMENT AT [facility] Provider Note  CSN: [id number] Arrival date & time: 02/11/23 2137  Chief Complaint(s) Abdominal Pain  HPI Kenneth Price is a 42 y.o. male who presents to the emergency department with suprapubic and right lower quadrant abdominal pain that radiates from the lumbar region.  Pain is worse with movement.  He denies any urinary symptoms.  No nausea or vomiting.  No diarrhea.  He does report feeling constipated.  Patient was seen 5 days ago for lower back pain, prescribed prednisone which she has been taken as well as Flexeril which the pharmacy reportedly did not give to the patient.  He denies any lower extremity weakness or loss of sensation.  No bladder/bowel incontinence.  He denies any trauma but reports working in [employer].  Lower back pain is actually been ongoing for several weeks, it just became worse 5 days ago.  The history is provided by the patient.    Past Medical History History reviewed. No pertinent past medical history. There are no problems to display for this patient.  Home Medication(s) Prior to Admission medications   Medication Sig Start Date End Date Taking? Authorizing Provider  naproxen (NAPROSYN) 375 MG tablet Take 1 tablet (375 mg total) by mouth 2 (two) times daily. 02/12/23  Yes Bond Grieshop, Amadeo Garnet, MD  cyclobenzaprine (FLEXERIL) 10 MG tablet Take 1 tablet (10 mg total) by mouth 3 (three) times daily for 5 days. 02/07/23 02/12/23  Claude Manges, PA-C  ondansetron (ZOFRAN ODT) 4 MG disintegrating tablet Take 1 tablet (4 mg total) by mouth every 8 (eight) hours as needed for nausea or vomiting. 02/23/18   Roxy Horseman, PA-C  predniSONE (DELTASONE) 10 MG tablet Take 4 tablets (40 mg total) by mouth daily for 5 days. 02/07/23 02/12/23  Claude Manges, PA-C                                                                                                                                     Allergies Patient has no known allergies.  Review of Systems Review of Systems As noted in HPI  Physical Exam Vital Signs  I have reviewed the triage vital signs BP 125/88   Pulse 68   Temp 98.8 F (37.1 C) (Oral)   Resp 18   Ht 5\' 6"  (1.676 m)   Wt 104.3 kg   SpO2 95%   BMI 37.11 kg/m   Physical Exam Vitals reviewed.  Constitutional:      General: He is not in acute distress.    Appearance: He is well-developed. He is not diaphoretic.  HENT:     Head: Normocephalic and atraumatic.     Right Ear: External ear normal.     Left Ear: External ear normal.     Nose: Nose normal.     Mouth/Throat:     Mouth: Mucous membranes are moist.  Eyes:     General: No  scleral icterus.    Conjunctiva/sclera: Conjunctivae normal.  Neck:     Trachea: Phonation normal.  Cardiovascular:     Rate and Rhythm: Normal rate and regular rhythm.  Pulmonary:     Effort: Pulmonary effort is normal. No respiratory distress.     Breath sounds: No stridor.  Abdominal:     General: There is no distension.     Tenderness: There is abdominal tenderness in the right lower quadrant and suprapubic area.  Musculoskeletal:        General: Normal range of motion.     Cervical back: Normal range of motion.     Lumbar back: Spasms and tenderness present. No bony tenderness.       Back:  Neurological:     Mental Status: He is alert and oriented to person, place, and time.  Psychiatric:        Behavior: Behavior normal.     ED Results and Treatments Labs (all labs ordered are listed, but only abnormal results are displayed) Labs Reviewed  COMPREHENSIVE METABOLIC PANEL - Abnormal; Notable for the following components:      Result Value   Glucose, Bld 121 (*)    Calcium 8.8 (*)    All other components within normal limits  CBC - Abnormal; Notable for the following components:   WBC 13.0 (*)    All other components within normal limits  URINALYSIS, ROUTINE W REFLEX MICROSCOPIC - Abnormal;  Notable for the following components:   Hgb urine dipstick SMALL (*)    All other components within normal limits  LIPASE, BLOOD                                                                                                                         EKG  EKG Interpretation  Date/Time:    Ventricular Rate:    PR Interval:    QRS Duration:   QT Interval:    QTC Calculation:   R Axis:     Text Interpretation:         Radiology CT ABDOMEN PELVIS W CONTRAST  Result Date: 02/12/2023 CLINICAL DATA:  Right lower quadrant pain EXAM: CT ABDOMEN AND PELVIS WITH CONTRAST TECHNIQUE: Multidetector CT imaging of the abdomen and pelvis was performed using the standard protocol following bolus administration of intravenous contrast. RADIATION DOSE REDUCTION: This exam was performed according to the departmental dose-optimization program which includes automated exposure control, adjustment of the mA and/or kV according to patient size and/or use of iterative reconstruction technique. CONTRAST:  75mL OMNIPAQUE IOHEXOL 350 MG/ML SOLN COMPARISON:  02/23/2018 FINDINGS: Lower chest: No acute abnormality Hepatobiliary: Diffuse low-density throughout the liver compatible with fatty infiltration. No focal abnormality. Gallbladder unremarkable. Pancreas: No focal abnormality or ductal dilatation. Spleen: No focal abnormality.  Normal size. Adrenals/Urinary Tract: No adrenal abnormality. No focal renal abnormality. No stones or hydronephrosis. Urinary bladder is unremarkable. Stomach/Bowel: Normal appendix. Stomach, large and small bowel grossly unremarkable. Vascular/Lymphatic: No evidence of aneurysm or adenopathy. Reproductive:  No visible focal abnormality. Other: [place] other Musculoskeletal: No acute bony abnormality. IMPRESSION: Normal appendix. Fatty liver. No acute findings. Electronically Signed   By: Charlett Nose M.D.   On: 02/12/2023 02:33    Medications Ordered in ED Medications  HYDROmorphone (DILAUDID)  injection 0.5 mg (0.5 mg Intravenous Given 02/12/23 0151)  sodium chloride 0.9 % bolus 1,000 mL (0 mLs Intravenous Stopped 02/12/23 0250)  iohexol (OMNIPAQUE) 350 MG/ML injection 75 mL (75 mLs Intravenous Contrast Given 02/12/23 0218)  ketorolac (TORADOL) 15 MG/ML injection 15 mg (15 mg Intravenous Given 02/12/23 0250)   Procedures Procedures  (including critical care time) Medical Decision Making / ED Course   Medical Decision Making Amount and/or Complexity of Data Reviewed Labs: ordered. Decision-making details documented in ED Course. Radiology: ordered and independent interpretation performed. Decision-making details documented in ED Course.  Risk Prescription drug management. Parenteral controlled substances. Decision regarding hospitalization.    Right lower abdominal and suprapubic pain.  Right-sided lumbar pain.  Differential includes but not limited to radiculopathy, lumbosacral strain, renal colic, appendicitis, urinary tract infection, colitis, SBO.  Patient given IVF and dilaudid for pain.  CBC with leukocytosis. This maybe from steroid use or inflammatory process. CMP without significant electrolyte derangement or renal insufficiency. No biliary obstruction or pancreatitis. UA w/o infection. CT w/o intraabdominal inflammatory/infectious process ( ie. Appendicitis), no SBO. No bony lesions.  Pain likely muscular. Given toradol.      Final Clinical Impression(s) / ED Diagnoses Final diagnoses:  Strain of lumbar region, subsequent encounter   The patient appears reasonably screened and/or stabilized for discharge and I doubt any other medical condition or other [facility] requiring further screening, evaluation, or treatment in the ED at this time. I have discussed the findings, Dx and Tx plan with the patient/family who expressed understanding and agree(s) with the plan. Discharge instructions discussed at length. The patient/family was given strict return precautions who  verbalized understanding of the instructions. No further questions at time of discharge.  Disposition: Discharge  Condition: Good  ED Discharge Orders          Ordered    naproxen (NAPROSYN) 375 MG tablet  2 times daily        02/12/23 0238              Follow Up: Primary care provider  Call  to schedule an appointment for close follow up     This chart was dictated using voice recognition software.  Despite best efforts to proofread,  errors can occur which can change the documentation meaning.    Nira Conn, MD 02/12/23 [phone]

## 2023-05-06 IMAGING — MR MR HEAD WO/W CM
6 of 12 series · 26 of 48 positions shown · IV contrast (Yes GAD)
Comparison: None available.

CLINICAL DATA: Initial evaluation for multiple sclerosis, new
event.

EXAM:
MRI HEAD WITHOUT AND WITH CONTRAST
TECHNIQUE: Multiplanar, multiecho pulse sequences of the brain and surrounding
structures were obtained without and with intravenous contrast.
CONTRAST:  10mL GADAVIST GADOBUTROL 1 MMOL/ML IV SOLN

[Series 2: DWI · axial · 3.0mm · 0.94mm/px · z∈[-26,+133]mm · 8 of 106 slices shown (1 of 2)]
[im 1/106]
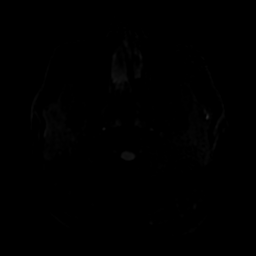
[im 16/106]
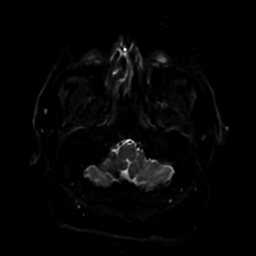
[im 31/106]
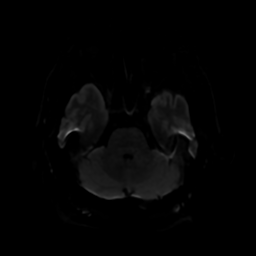
[im 46/106]
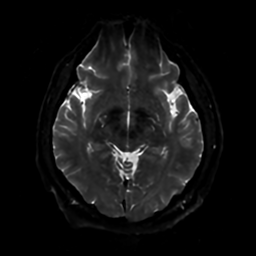
[im 61/106]
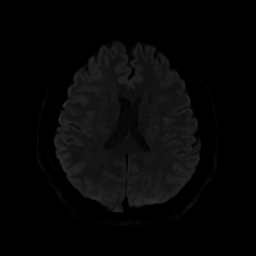
[im 76/106]
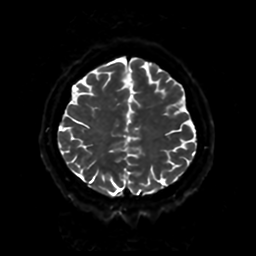
[im 91/106]
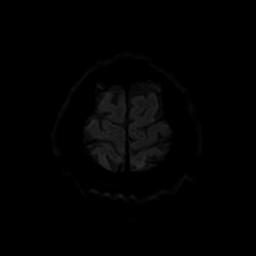
[im 106/106]
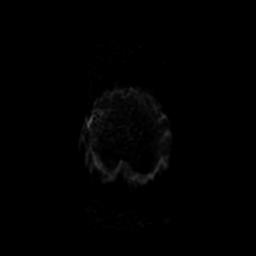

[Series 3: DWI · coronal · 4.0mm · 0.94mm/px · 6 of 74 slices shown (2 of 2)]
[im 1/74]
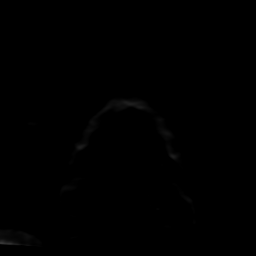
[im 15/74]
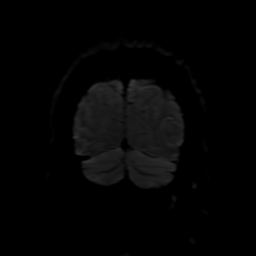
[im 30/74]
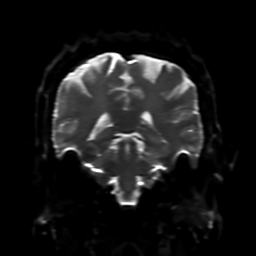
[im 44/74]
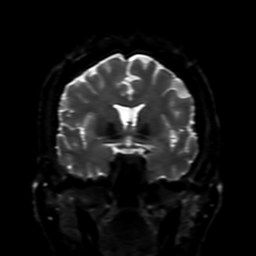
[im 59/74]
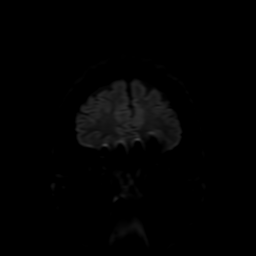
[im 74/74]
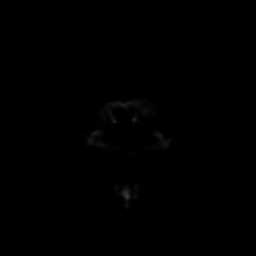

[Series 4: FLAIR · sagittal · 5.0mm · 0.23mm/px · 2 of 27 slices shown (1 of 2)]
[im 1/27]
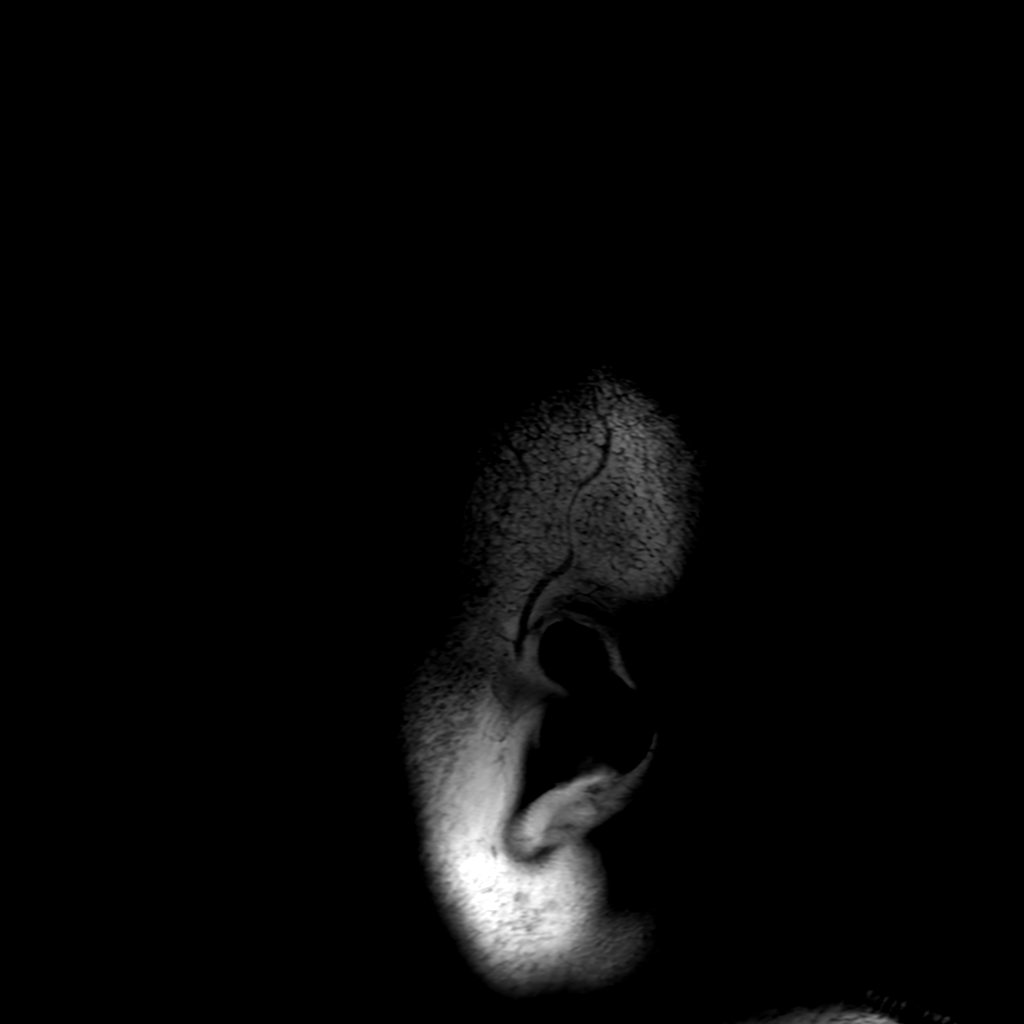
[im 27/27]
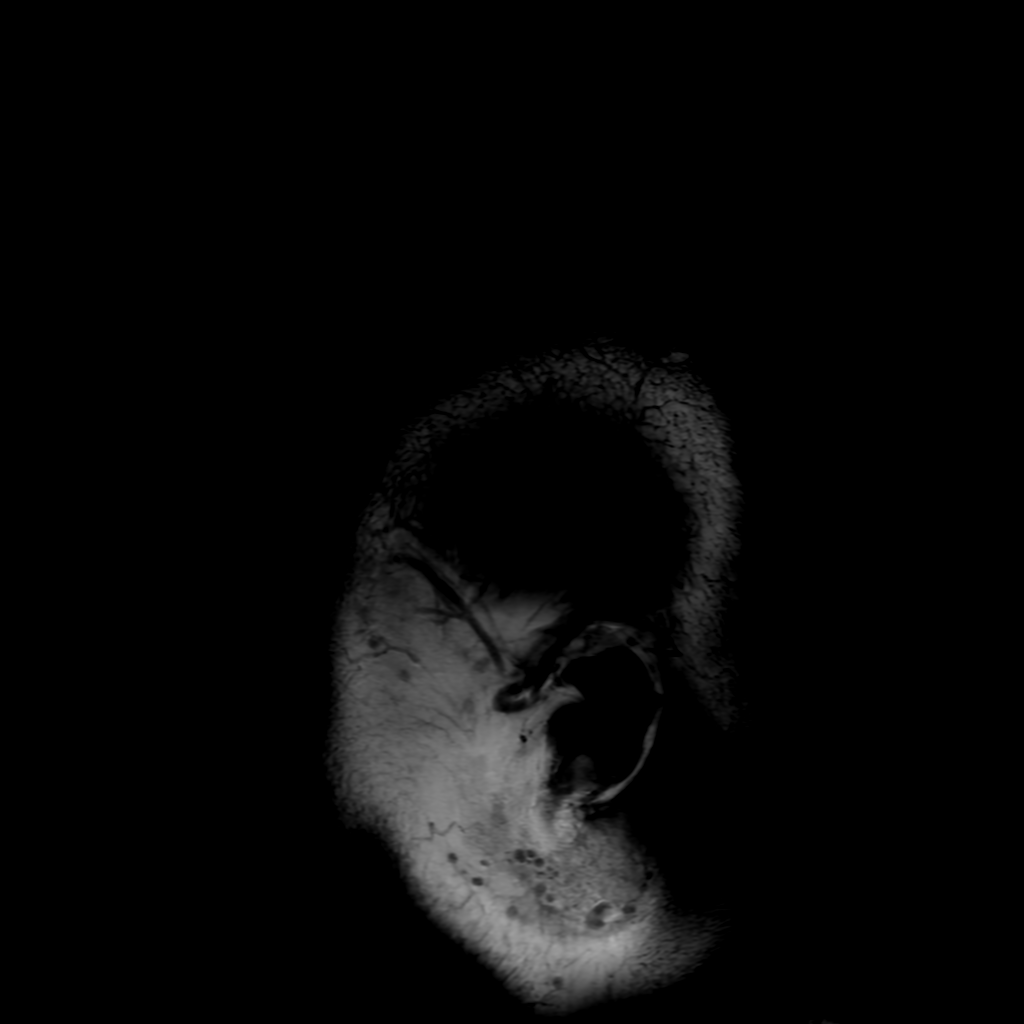

[Series 6: FLAIR · axial · 4.0mm · 0.45mm/px · z∈[-26,+132]mm · 3 of 37 slices shown (2 of 2)]
[im 1/37]
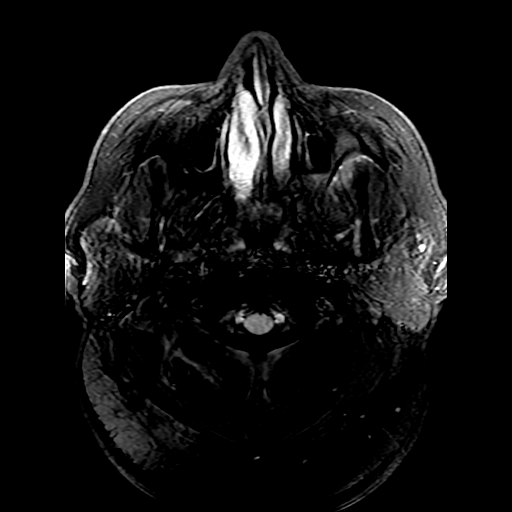
[im 19/37]
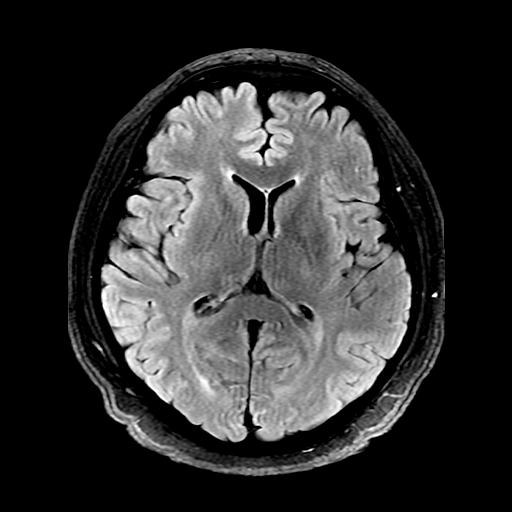
[im 37/37]
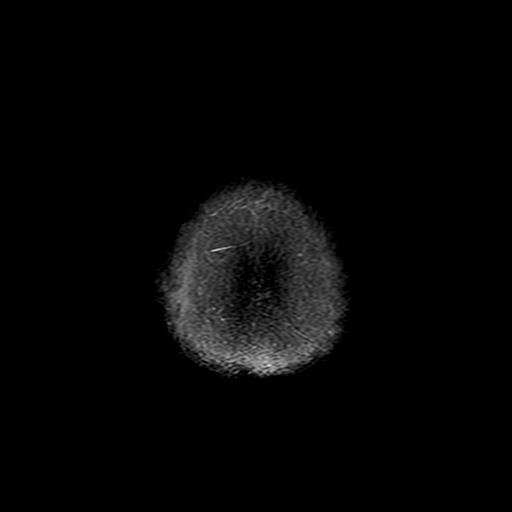

[Series 250: ADC · axial · 3.0mm · 0.94mm/px · z∈[-26,+133]mm · 4 of 54 slices shown (1 of 2)]
[im 1/54]
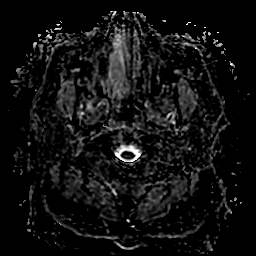
[im 18/54]
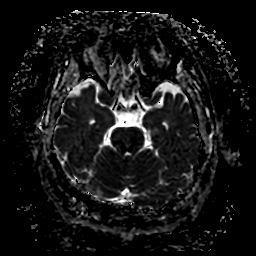
[im 36/54]
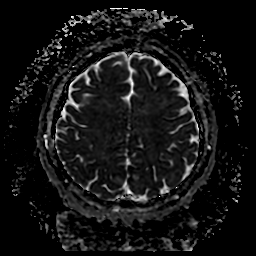
[im 54/54]
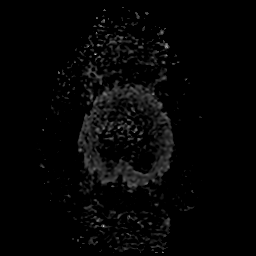

[Series 350: ADC · coronal · 4.0mm · 0.94mm/px · 3 of 37 slices shown (2 of 2)]
[im 1/37]
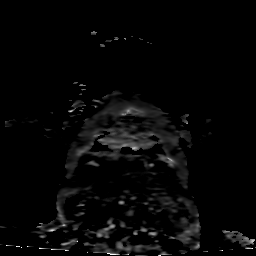
[im 19/37]
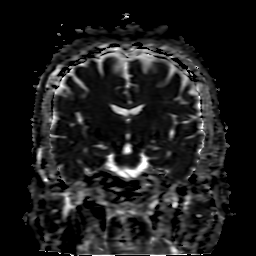
[im 37/37]
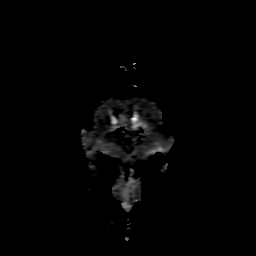

[26 of 48 positions shown; findings below may reference images not displayed]

FINDINGS: Brain: Cerebral volume within normal limits for patient age. No
focal parenchymal signal abnormality identified. No findings to
suggest demyelinating disease.

No abnormal foci of restricted diffusion to suggest acute or
subacute ischemia. Gray-white matter differentiation well
maintained. No encephalomalacia to suggest chronic infarction. No
foci of susceptibility artifact to suggest acute or chronic
intracranial hemorrhage.

No mass lesion, midline shift or mass effect. No hydrocephalus.
Possible small 1.8 cm benign arachnoid cyst at the anterior left
temporal pole. No other extra-axial fluid collection.

Pituitary gland and suprasellar region are normal. Midline
structures intact and normal.

No abnormal enhancement.

Vascular: Major intracranial vascular flow voids well maintained and
normal in appearance.

Skull and upper cervical spine: Craniocervical junction normal.
Visualized upper cervical spine within normal limits. Bone marrow
signal intensity normal. No scalp soft tissue abnormality.

Sinuses/Orbits: Globes and orbital soft tissues within normal
limits.

Scattered mucosal thickening noted within the sphenoid ethmoidal and
maxillary sinuses. Paranasal sinuses are otherwise largely clear. No
significant mastoid effusion. Inner ear structures grossly normal.

Other: None.
IMPRESSION: Normal brain MRI.  No acute intracranial abnormality identified.
